# Patient Record
Sex: Female | Born: 2000 | Race: Black or African American | Hispanic: No | Marital: Single | Smoking: Never smoker
Health system: Southern US, Community
[De-identification: ages and names within clinical notes are randomized; demographics above are authoritative.]

## PROBLEM LIST (undated history)

## (undated) DIAGNOSIS — I890 Lymphedema, not elsewhere classified: Secondary | ICD-10-CM

## (undated) DIAGNOSIS — M1990 Unspecified osteoarthritis, unspecified site: Secondary | ICD-10-CM

## (undated) DIAGNOSIS — M719 Bursopathy, unspecified: Secondary | ICD-10-CM

## (undated) DIAGNOSIS — F840 Autistic disorder: Secondary | ICD-10-CM

## (undated) HISTORY — PX: WISDOM TOOTH EXTRACTION: SHX21

---
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
## 2018-03-10 DIAGNOSIS — Z0000 Encounter for general adult medical examination without abnormal findings: Secondary | ICD-10-CM | POA: Diagnosis not present

## 2018-03-10 DIAGNOSIS — Z7182 Exercise counseling: Secondary | ICD-10-CM | POA: Diagnosis not present

## 2018-03-10 DIAGNOSIS — Z713 Dietary counseling and surveillance: Secondary | ICD-10-CM | POA: Diagnosis not present

## 2018-04-18 DIAGNOSIS — H5213 Myopia, bilateral: Secondary | ICD-10-CM | POA: Diagnosis not present

## 2018-05-02 IMAGING — CR DG FOOT COMPLETE 3+V*R*
3 series · 3 of 3 positions shown · non-contrast
Comparison: None.

CLINICAL DATA: Stabbing injury of the right small toe on a wall
bruising. Injury three days prior to imaging.

EXAM:
RIGHT FOOT COMPLETE - 3+ VIEW

[t foot ap right]
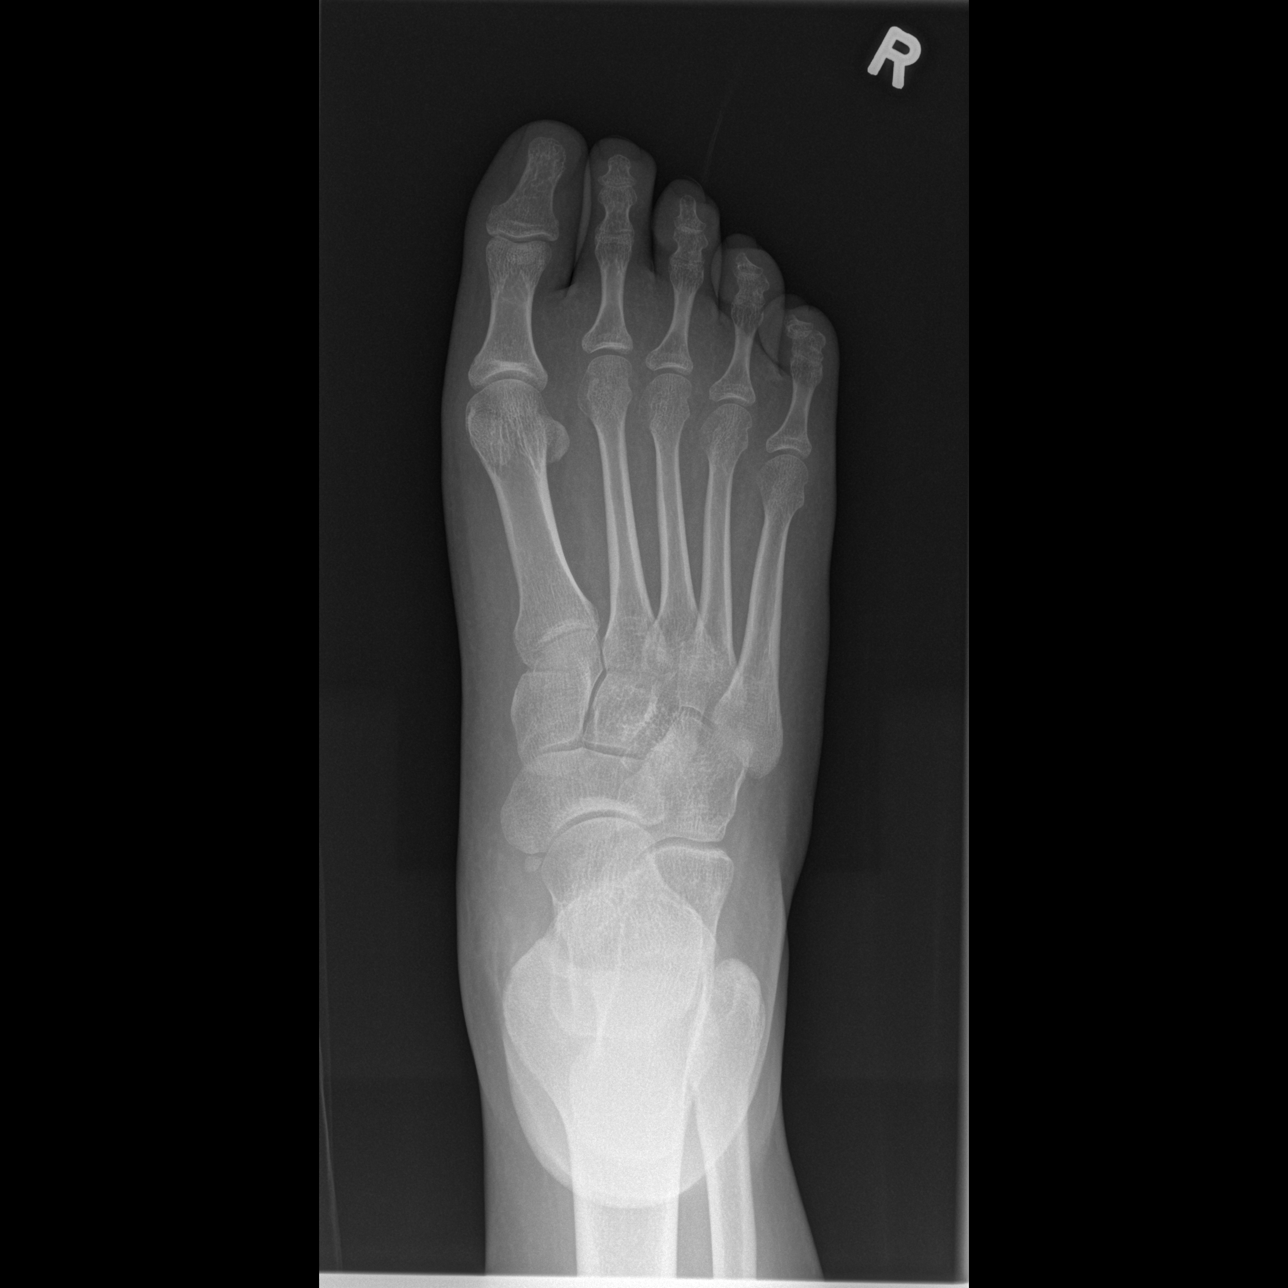

[t foot oblique right]
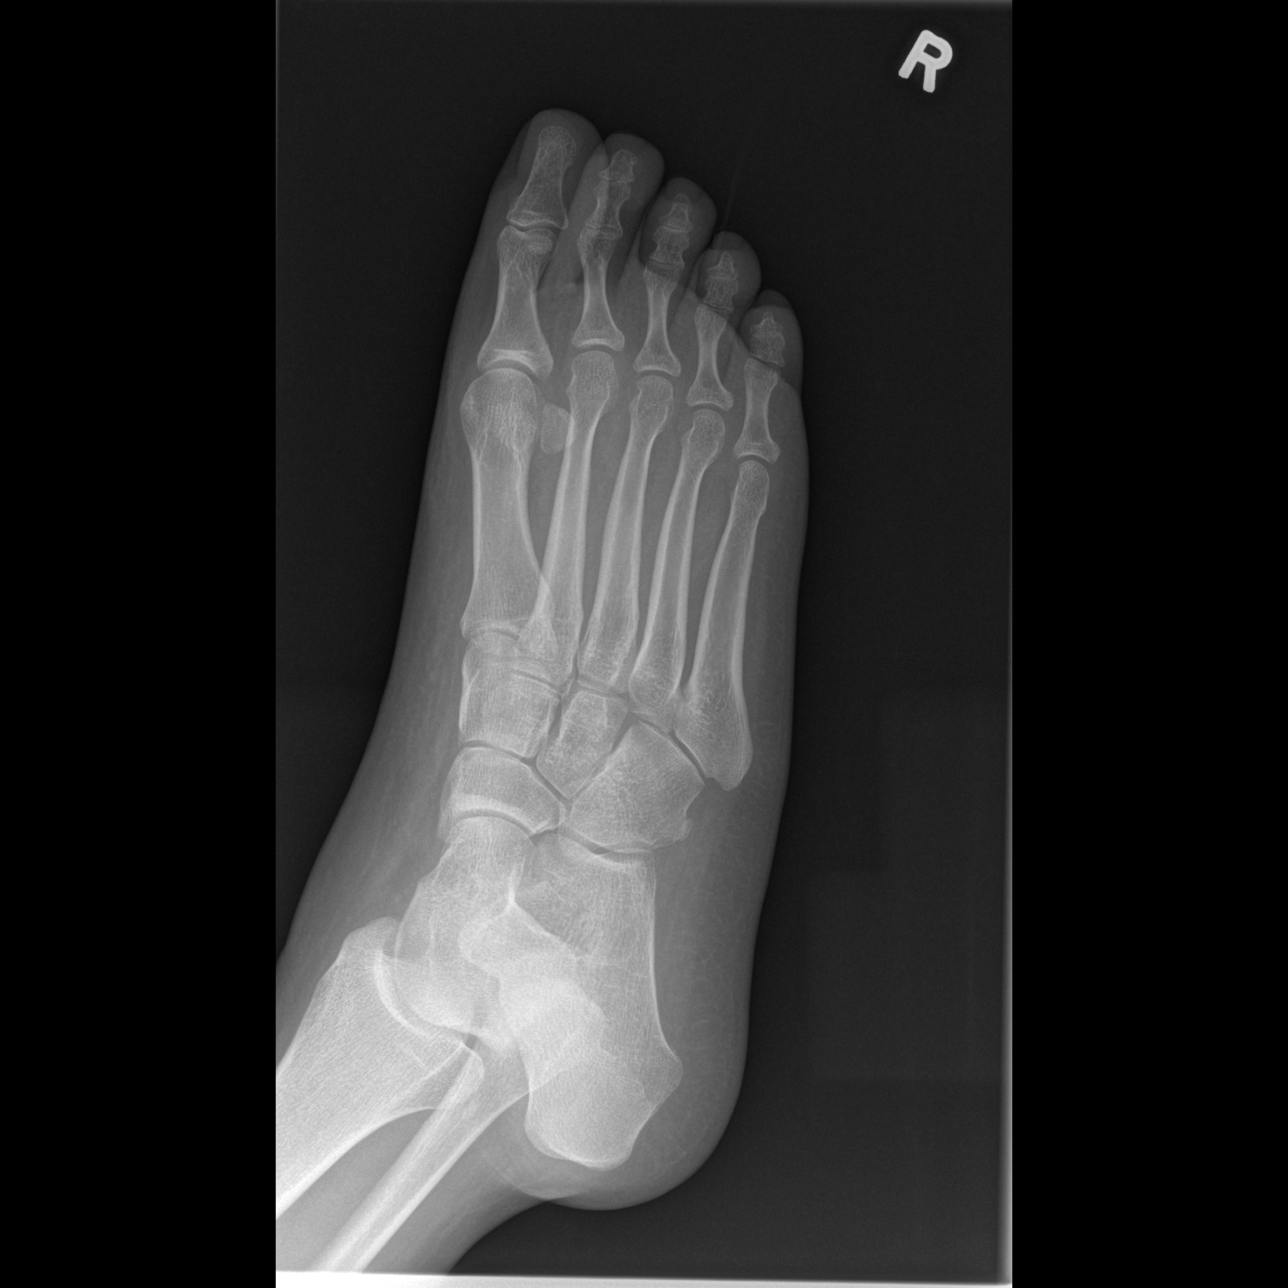

[t foot lat right]
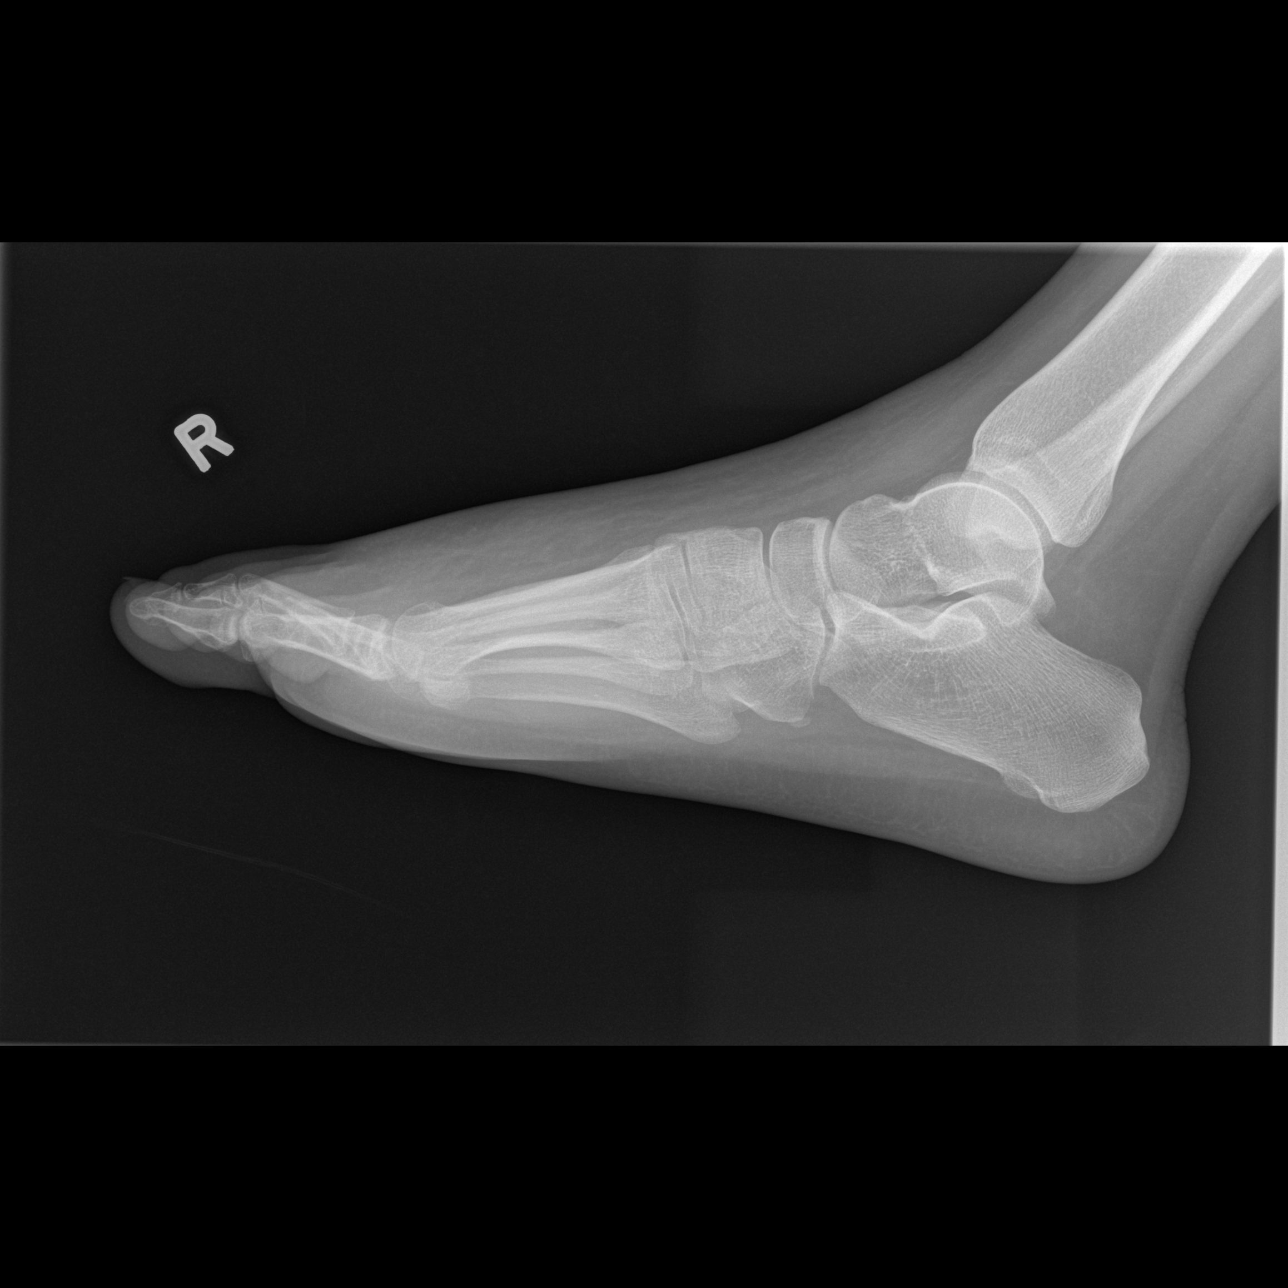

[3 of 3 positions shown; findings below may reference images not displayed]

FINDINGS: Type 1 accessory navicular.  No malalignment at the Lisfranc joint.

Small os peroneus. Abnormal soft tissue swelling along the dorsum of
the foot. I do not see a discrete fracture of the fifth toe or in
the rest of the forefoot. Sensitivity mildly adversely affected by
flexion of the small toe on the frontal images, an by
superimposition of all of the toes on the lateral projection.
IMPRESSION: 1. Abnormal soft tissue swelling along the dorsum of the foot.
2. No visible fracture; bony superimposition and flexion mildly
reduced diagnostic sensitivity in assessing the small toe.

## 2018-12-18 DIAGNOSIS — I890 Lymphedema, not elsewhere classified: Secondary | ICD-10-CM | POA: Diagnosis not present

## 2024-01-31 ENCOUNTER — Emergency Department (HOSPITAL_BASED_OUTPATIENT_CLINIC_OR_DEPARTMENT_OTHER)
Admission: EM | Admit: 2024-01-31 | Discharge: 2024-01-31 | Disposition: A | Discharge status: Home / Self Care | Payer: Self-pay | Attending: Emergency Medicine | Admitting: Emergency Medicine

## 2024-01-31 ENCOUNTER — Encounter (HOSPITAL_BASED_OUTPATIENT_CLINIC_OR_DEPARTMENT_OTHER): Payer: Self-pay

## 2024-01-31 ENCOUNTER — Emergency Department (HOSPITAL_BASED_OUTPATIENT_CLINIC_OR_DEPARTMENT_OTHER): Payer: Self-pay | Admitting: Radiology

## 2024-01-31 ENCOUNTER — Emergency Department (HOSPITAL_BASED_OUTPATIENT_CLINIC_OR_DEPARTMENT_OTHER): Payer: Self-pay

## 2024-01-31 ENCOUNTER — Other Ambulatory Visit: Payer: Self-pay

## 2024-01-31 DIAGNOSIS — X58XXXA Exposure to other specified factors, initial encounter: Secondary | ICD-10-CM | POA: Insufficient documentation

## 2024-01-31 DIAGNOSIS — F840 Autistic disorder: Secondary | ICD-10-CM | POA: Insufficient documentation

## 2024-01-31 DIAGNOSIS — S99912A Unspecified injury of left ankle, initial encounter: Secondary | ICD-10-CM | POA: Insufficient documentation

## 2024-01-31 DIAGNOSIS — Y9343 Activity, gymnastics: Secondary | ICD-10-CM | POA: Insufficient documentation

## 2024-01-31 DIAGNOSIS — Y9239 Other specified sports and athletic area as the place of occurrence of the external cause: Secondary | ICD-10-CM | POA: Insufficient documentation

## 2024-01-31 LAB — PREGNANCY, URINE: Preg Test, Ur: NEGATIVE

## 2024-01-31 NOTE — ED Triage Notes (Signed)
 Pt presents, ambulatory to triage, c/o L ankle ongoing pain after going to gym on Thursday, denies injury/trauma. Denies swelling/redness, tender to palpation, difficulty bearing weight. Boot from home in place in triage.

## 2024-01-31 NOTE — Discharge Instructions (Signed)
 As discussed, your x-ray is reassuring.  Alternate between ibuprofen 600 mg and Tylenol 500 mg every 4 hours as needed for pain.  Wear the wrap to help with pain and swelling.  You can ice your ankle when you are at rest.  I have provided information for orthopedics to follow-up with if the pain persist.  Return to the ED if symptoms worsen in the interim.

## 2024-01-31 NOTE — ED Provider Notes (Signed)
 [facility] Provider Note   CSN: [id number] Arrival date & time: 01/31/24  1658     History  Chief Complaint  Patient presents with   Ankle Pain    Carolyn Jones is a 23 y.o. female with a history of autism, lymphedema, and arthritis who presents the ED today for ankle pain.  Patient reports she was doing her RDLs at the gym 6 days ago when she started having pain to the left lateral ankle.  Denies any known injury or trauma.  She has been wearing a boot that she had at home with ambulation due to pain with improvement.  No redness or swelling to the foot or ankle.  Denies associated foot pain.  No additional complaints or concerns at this time.    Home Medications Prior to Admission medications   Medication Sig Start Date End Date Taking? Authorizing Provider  naproxen (NAPROSYN) 250 MG tablet Take by mouth 2 (two) times daily with a meal.    [provider]      Allergies    Patient has no known allergies.    Review of Systems   Review of Systems  Musculoskeletal:  Positive for arthralgias.  All other systems reviewed and are negative.   Physical Exam Updated Vital Signs BP (!) 126/93   Pulse 73   Temp (!) 97.1 F (36.2 C)   Resp 18   Ht 5' 9 (1.753 m)   Wt 73.9 kg   LMP 01/04/2024 (Approximate)   SpO2 97%   BMI 24.07 kg/m  Physical Exam Vitals and nursing note reviewed.  Constitutional:      General: She is not in acute distress.    Appearance: Normal appearance.  HENT:     Head: Normocephalic and atraumatic.     Mouth/Throat:     Mouth: Mucous membranes are moist.  Eyes:     Conjunctiva/sclera: Conjunctivae normal.     Pupils: Pupils are equal, round, and reactive to light.  Cardiovascular:     Rate and Rhythm: Normal rate and regular rhythm.     Pulses: Normal pulses.     Heart sounds: Normal heart sounds.  Pulmonary:     Effort: Pulmonary effort is normal.     Breath sounds: Normal breath  sounds.  Musculoskeletal:        General: Tenderness present. Normal range of motion.     Cervical back: Normal range of motion.     Comments: TTP of left lateral ankle beneath the lateral malleoli. No swelling, erythema, or warmth to touch.  She is neurovascularly intact.  Strength, sensation, strength intact of the foot.  Skin:    General: Skin is warm and dry.     Findings: No rash.  Neurological:     General: No focal deficit present.     Mental Status: She is alert.     Sensory: No sensory deficit.     Motor: No weakness.  Psychiatric:        Mood and Affect: Mood normal.        Behavior: Behavior normal.     ED Results / Procedures / Treatments   Labs (all labs ordered are listed, but only abnormal results are displayed) Labs Reviewed  PREGNANCY, URINE    EKG None  Radiology DG Ankle Complete Left Result Date: 01/31/2024 CLINICAL DATA:  Left ankle pain after going to the gym on Thursday. EXAM: LEFT ANKLE COMPLETE - 3 VIEW COMPARISON:  None Available. FINDINGS:  There is no evidence of fracture, dislocation, or joint effusion. There is no evidence of arthropathy or other focal bone abnormality. Soft tissues are unremarkable. IMPRESSION: No acute osseous abnormality. Electronically Signed   By: Adrianna Horde M.D.   On: 01/31/2024 18:03    Procedures Procedures    Medications Ordered in ED Medications - No data to display  ED Course/ Medical Decision Making/ A&P                                 Medical Decision Making Amount and/or Complexity of Data Reviewed Labs: ordered. Radiology: ordered.   This patient presents to the ED for concern of ankle pain, this involves an extensive number of treatment options, and is a complaint that carries with it a high risk of complications and morbidity.   Differential diagnosis includes: fracture, dislocation, ankle sprain, contusion, etc.   Comorbidities  See HPI above   Additional History  Additional history  obtained from prior records   Lab Tests  I ordered and personally interpreted labs.  The pertinent results include:   Negative pregnancy test   Imaging Studies  I ordered imaging studies including left ankle x-ray  I independently visualized and interpreted imaging which showed: No acute osseous abnormality. I agree with the radiologist interpretation   Problem List / ED Course / Critical Interventions / Medication Management  Patient reports pain to the left lateral ankle for the past 6 days.  She denies any known injury or trauma to the foot.  States that she was working at the gym the pain for started.  Is able to ambulate on despite pain.  She has been wearing a boot that she has at home with some improvement of symptoms. No redness, swelling, warmth to touch of the ankle.  No tenderness to palpation over the foot.  She is neurovascularly intact. Discussed findings with patient.  All questions were answered. Will give Ace wrap to help with compression.  Told her she can still wear her boot as needed as well.  Declined crutches.  Advised RICE therapy.  Information for orthopedics provided in case symptoms persist.   Social Determinants of Health  Physical activity   Test / Admission - Considered  Patient is stable and safe for discharge home. Return precautions given.       Final Clinical Impression(s) / ED Diagnoses Final diagnoses:  Left ankle injury, initial encounter    Rx / DC Orders ED Discharge Orders     None         Sonnie Dusky, PA-C 01/31/24 1853    Scarlette Currier, MD 02/03/24 2320
